# Patient Record
Sex: Female | Born: 1995 | Race: White | Hispanic: No | Marital: Single | Smoking: Never smoker
Health system: Southern US, Community
[De-identification: ages and names within clinical notes are randomized; demographics above are authoritative.]

---
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
## 2016-06-27 ENCOUNTER — Encounter (HOSPITAL_COMMUNITY): Payer: Self-pay | Admitting: *Deleted

## 2016-06-27 ENCOUNTER — Emergency Department (HOSPITAL_COMMUNITY)
Admission: EM | Admit: 2016-06-27 | Discharge: 2016-06-27 | Disposition: A | Discharge status: Home / Self Care | Payer: Self-pay | Attending: Emergency Medicine | Admitting: Emergency Medicine

## 2016-06-27 ENCOUNTER — Emergency Department (HOSPITAL_COMMUNITY): Payer: Self-pay

## 2016-06-27 DIAGNOSIS — L509 Urticaria, unspecified: Secondary | ICD-10-CM | POA: Insufficient documentation

## 2016-06-27 DIAGNOSIS — B09 Unspecified viral infection characterized by skin and mucous membrane lesions: Secondary | ICD-10-CM | POA: Insufficient documentation

## 2016-06-27 LAB — RAPID STREP SCREEN (MED CTR MEBANE ONLY): Streptococcus, Group A Screen (Direct): NEGATIVE

## 2016-06-27 MED ORDER — DIPHENHYDRAMINE HCL 25 MG PO CAPS
25.0000 mg | ORAL_CAPSULE | Freq: Once | ORAL | Status: AC
  Administered 2016-06-27: 25 mg via ORAL
  Filled 2016-06-27: qty 1

## 2016-06-27 NOTE — ED Triage Notes (Signed)
Pt complains of intermittent fever for the past 4 days, rash to legs and arms since last night. Pt went to [facility] today and was given steroid shot and prescription for prednisone. Pt states she feels more tired and "out of it" since visiting [facility]. Pt denies cough, nausea, vomiting, diarrhea. Pt states rash has improved since receiving shot.

## 2016-06-27 NOTE — ED Provider Notes (Signed)
[facility]-EMERGENCY DEPT Provider Note   CSN: [id number] Arrival date & time: 06/27/16  1944  By signing my name below, I, Octavia Heirrianna Nassar, attest that this documentation has been prepared under the direction and in the presence of Audry Piliyler Kingsten Enfield, PA-C.  Electronically Signed: Octavia HeirArianna Nassar, ED Scribe. 06/27/16. 7:59 PM.    History   Chief Complaint Chief Complaint  Patient presents with  . Fever  . Rash    The history is provided by the patient. No language interpreter was used.   HPI Comments: Monique Collins is a 20 y.o. female who presents to the Emergency Department complaining of intermittent fever (102-103) x 4 days. She has been having an associated shortness of breath, nausea,  Erythematous/itchy rash to her inner thigh that has spread to the rest of her boyd. Pt has not taken any medication to alleviate her fever. She was seen as [facility] earlier today where she received a steroid shot and a prescription for prednisone. Pt states that her rash is gradually getting better after receiving the shot. She has been around sick contacts at her job. Dad notes that pt recently wore new pants from another country but does not know any other source. Denies any new detergents, food or exposure to new medications.  She further denies neck pain, vomiting, abdominal pain, chest pain, or dysuria.  History reviewed. No pertinent past medical history.  There are no active problems to display for this patient.   History reviewed. No pertinent surgical history.  OB History    No data available       Home Medications    Prior to Admission medications   Not on File    Family History No family history on file.  Social History Social History  Substance Use Topics  . Smoking status: Never Smoker  . Smokeless tobacco: Never Used  . Alcohol use No     Allergies   Patient has no allergy information on record.   Review of Systems Review of Systems  Constitutional: Positive for fever.    Cardiovascular: Negative for chest pain.  Gastrointestinal: Negative for abdominal pain.  Genitourinary: Negative for dysuria.  Musculoskeletal: Negative for neck pain.  Skin: Positive for rash.     Physical Exam Updated Vital Signs BP (!) 119/101 (BP Location: Left Arm)   Pulse 116   Temp 99.9 F (37.7 C) (Oral)   Resp 18   Ht 5\' 2"  (1.575 m)   Wt 62.6 kg   LMP 06/08/2016   SpO2 100%   BMI 25.24 kg/m   Physical Exam  Constitutional: She is oriented to person, place, and time. She appears well-developed and well-nourished.  Pt phonating well  HENT:  Head: Normocephalic.  Post oropharynx has no exudate, edema or erythema  Eyes: EOM are normal.  Neck: Normal range of motion.  No nuchal rigidity, FROM of neck without pain  Cardiovascular: Normal rate.   Pulmonary/Chest: Effort normal.  Abdominal: She exhibits no distension.  Musculoskeletal: Normal range of motion.  Neurological: She is alert and oriented to person, place, and time.  Skin: Rash noted. There is erythema.  Diffuse erythematous urticarial rash to the bilateral upper/lower extremities, torso and back. Palms and feet spared.   Psychiatric: She has a normal mood and affect.  Nursing note and vitals reviewed.  ED Treatments / Results  DIAGNOSTIC STUDIES: Oxygen Saturation is 100% on RA, normal by my interpretation.  COORDINATION OF CARE:  8:31 PM Discussed treatment plan with pt at bedside and pt agreed  to plan.  Labs (all labs ordered are listed, but only abnormal results are displayed) Labs Reviewed - No data to display  EKG  EKG Interpretation None       Radiology Dg Chest 2 View  Result Date: 06/27/2016 CLINICAL DATA:  Cough EXAM: CHEST  2 VIEW COMPARISON:  None. FINDINGS: Normal heart size and mediastinal contours. No acute infiltrate or edema. No effusion or pneumothorax. No acute osseous findings. IMPRESSION: Negative chest. Electronically Signed   By: Marnee SpringJonathon  Watts M.D.   On:  06/27/2016 20:51    Procedures Procedures (including critical care time)  Medications Ordered in ED Medications - No data to display   Initial Impression / Assessment and Plan / ED Course  I have reviewed the triage vital signs and the nursing notes.  Pertinent labs & imaging results that were available during my care of the patient were reviewed by me and considered in my medical decision making (see chart for details).  Clinical Course    Final Clinical Impressions(s) / ED Diagnoses  I have reviewed and evaluated the relevant laboratory values I have reviewed and evaluated the relevant imaging studies.  I have reviewed the relevant previous healthcare records.I obtained HPI from historian.  ED Course:  Assessment: Pt is a 20yF who presents with fever and rash x 4 days. [facility] at [facility]. Rx prednisone. On exam, pt in NAD. Nontoxic/nonseptic appearing. VS. Slight tachycardia. Afebrile. Lungs CTA. Heart RRR. Abdomen nontender soft. Posterior oropharynx with mild erythema. No exudate. No nuchal rigidity. Full ROM of neck without pain or discomfort. CXR unremarkable. Strep negative. Given benadryl in ED. Likely Viral exanthem. Improved in ED. Continued steroids and benadryl. Follow up to PCP. Plan is to DC home. At time of discharge, Patient is in no acute distress. Vital Signs are stable. Patient is able to ambulate. Patient able to tolerate PO.   Disposition/Plan:  DC Home Additional Verbal discharge instructions given and discussed with patient.  Pt Instructed to f/u with PCP in the next week for evaluation and treatment of symptoms. Return precautions given Pt acknowledges and agrees with plan  Supervising Physician Arby BarretteMarcy Pfeiffer, MD   Final diagnoses:  Viral exanthem   I personally performed the services described in this documentation, which was scribed in my presence. The recorded information has been reviewed and is accurate.   New  Prescriptions New Prescriptions   No medications on file     Audry Piliyler Penn Gr[place]ett, PA-C 06/27/16 2120    Arby BarretteMarcy Pfeiffer, MD 06/27/16 2332

## 2016-06-27 NOTE — Discharge Instructions (Signed)
Please read and follow all provided instructions.  Your diagnoses today include:  1. Viral exanthem    You appear to have an upper respiratory infection (URI). An upper respiratory tract infection, or cold, is a viral infection of the air passages leading to the lungs. It should improve gradually after 5-7 days. You may have a lingering cough that lasts for 2- 4 weeks after the infection.  Tests performed today include: Vital signs. See below for your results today.   Medications prescribed:   Take any prescribed medications only as directed. Treatment for your infection is aimed at treating the symptoms. There are no medications, such as antibiotics, that will cure your infection.   Home care instructions:  Follow any educational materials contained in this packet.   Your illness is contagious and can be spread to others, especially during the first 3 or 4 days. It cannot be cured by antibiotics or other medicines. Take basic precautions such as washing your hands often, covering your mouth when you cough or sneeze, and avoiding public places where you could spread your illness to others.   Please continue drinking plenty of fluids.  Use over-the-counter medicines as needed as directed on packaging for symptom relief.  You may also use ibuprofen or tylenol as directed on packaging for pain or fever.  Do not take multiple medicines containing Tylenol or acetaminophen to avoid taking too much of this medication.  Follow-up instructions: Please follow-up with your primary care provider in the next 3 days for further evaluation of your symptoms if you are not feeling better.   Return instructions:  Please return to the Emergency Department if you experience worsening symptoms.  RETURN IMMEDIATELY IF you develop shortness of breath, confusion or altered mental status, a new rash, become dizzy, faint, or poorly responsive, or are unable to be cared for at home. Please return if you have  persistent vomiting and cannot keep down fluids or develop a fever that is not controlled by tylenol or motrin.   Please return if you have any other emergent concerns.  Additional Information:  Your vital signs today were: BP (!) 119/101 (BP Location: Left Arm)    Pulse 116    Temp 99.9 F (37.7 C) (Oral)    Resp 18    Ht 5\' 2"  (1.575 m)    Wt 62.6 kg    LMP 06/08/2016    SpO2 100%    BMI 25.24 kg/m  If your blood pressure (BP) was elevated above 135/85 this visit, please have this repeated by your doctor within one month. --------------

## 2016-06-29 LAB — CULTURE, GROUP A STREP (THRC)

## 2016-06-30 ENCOUNTER — Telehealth (HOSPITAL_BASED_OUTPATIENT_CLINIC_OR_DEPARTMENT_OTHER): Payer: Self-pay

## 2016-06-30 NOTE — Telephone Encounter (Signed)
No treatment for Strep culture per Malena Peerorey Bal Pharm D

## 2017-12-25 IMAGING — CR DG CHEST 2V
2 series · 2 of 2 positions shown · non-contrast
Comparison: None.

CLINICAL DATA: Cough

EXAM:
CHEST  2 VIEW

[w chest pa]
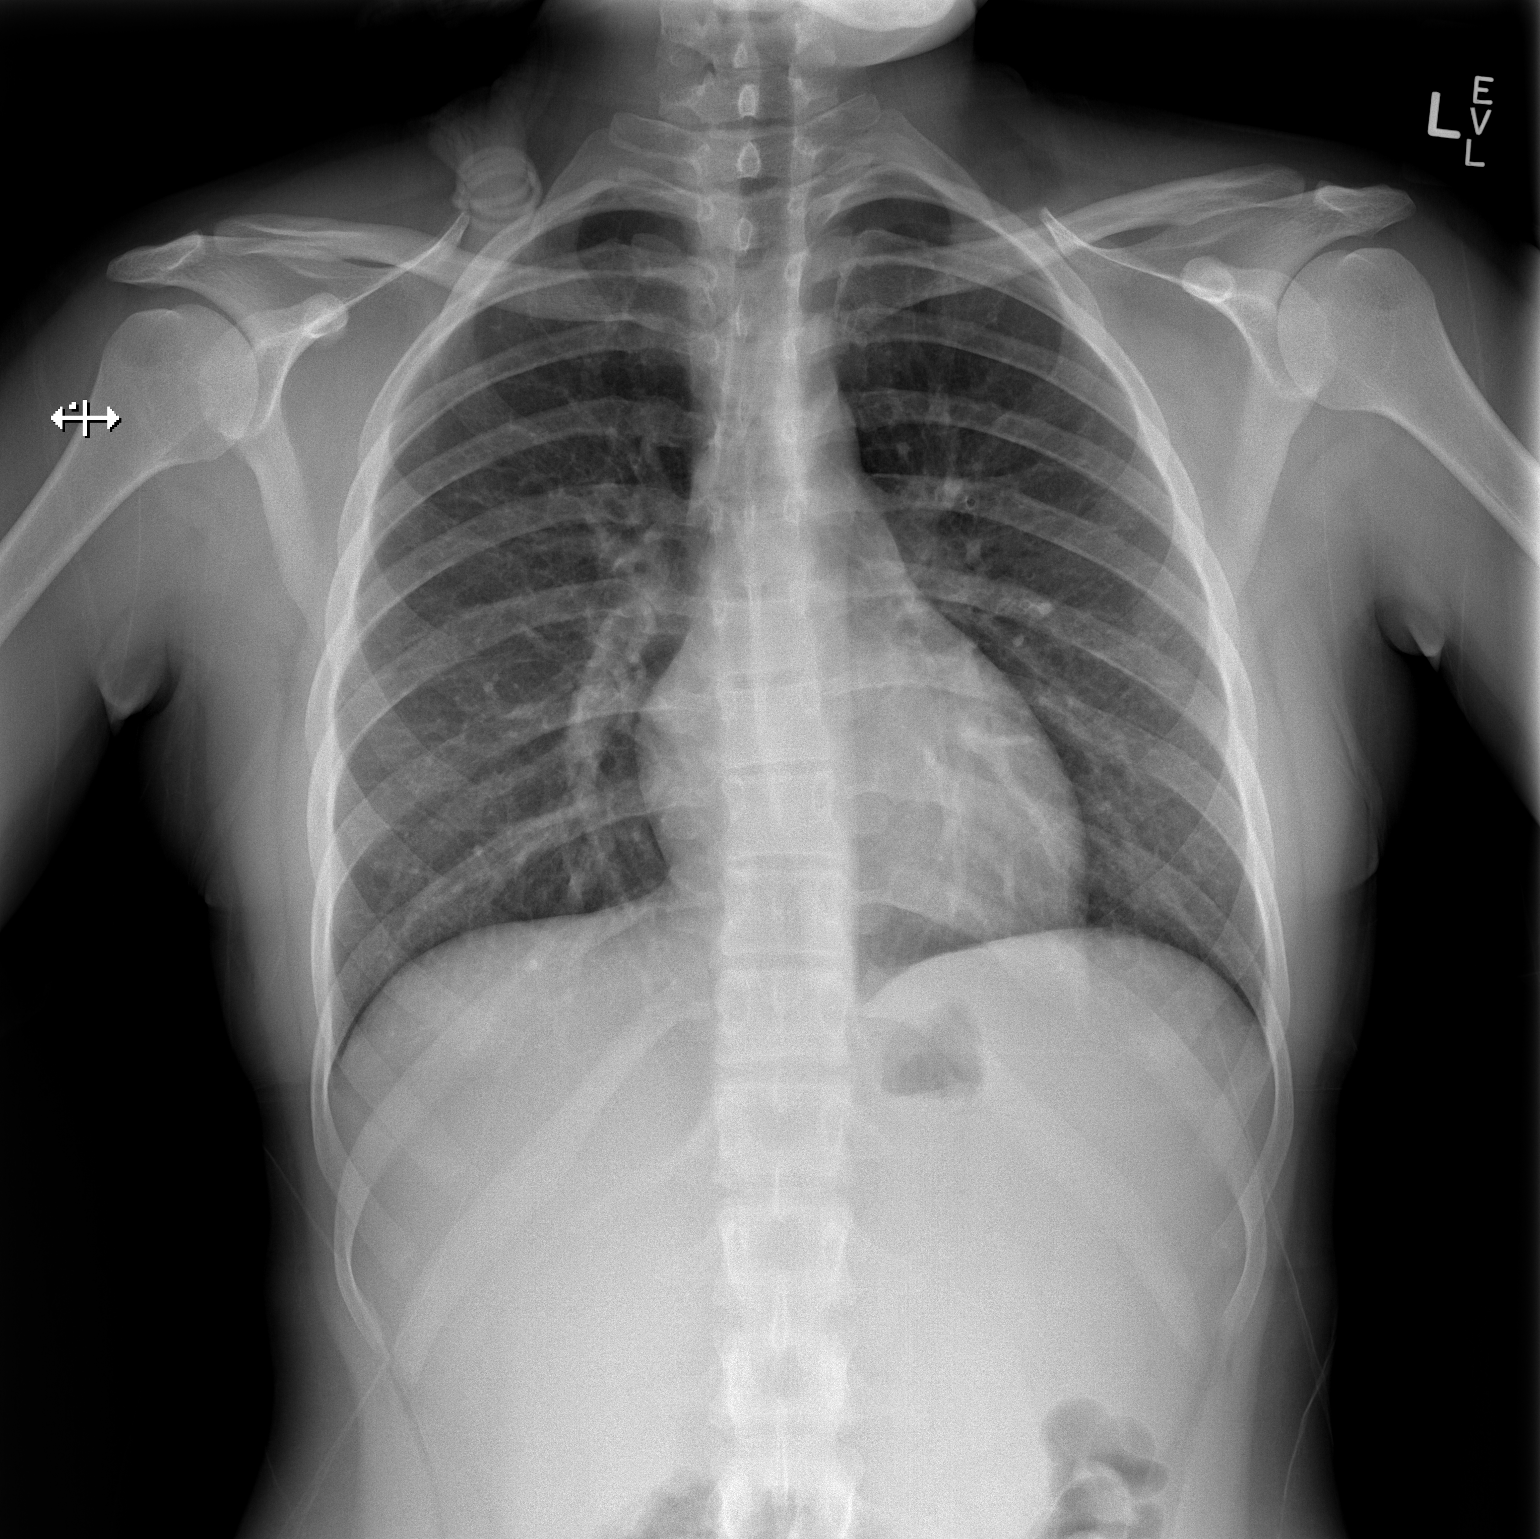

[w chest lat]
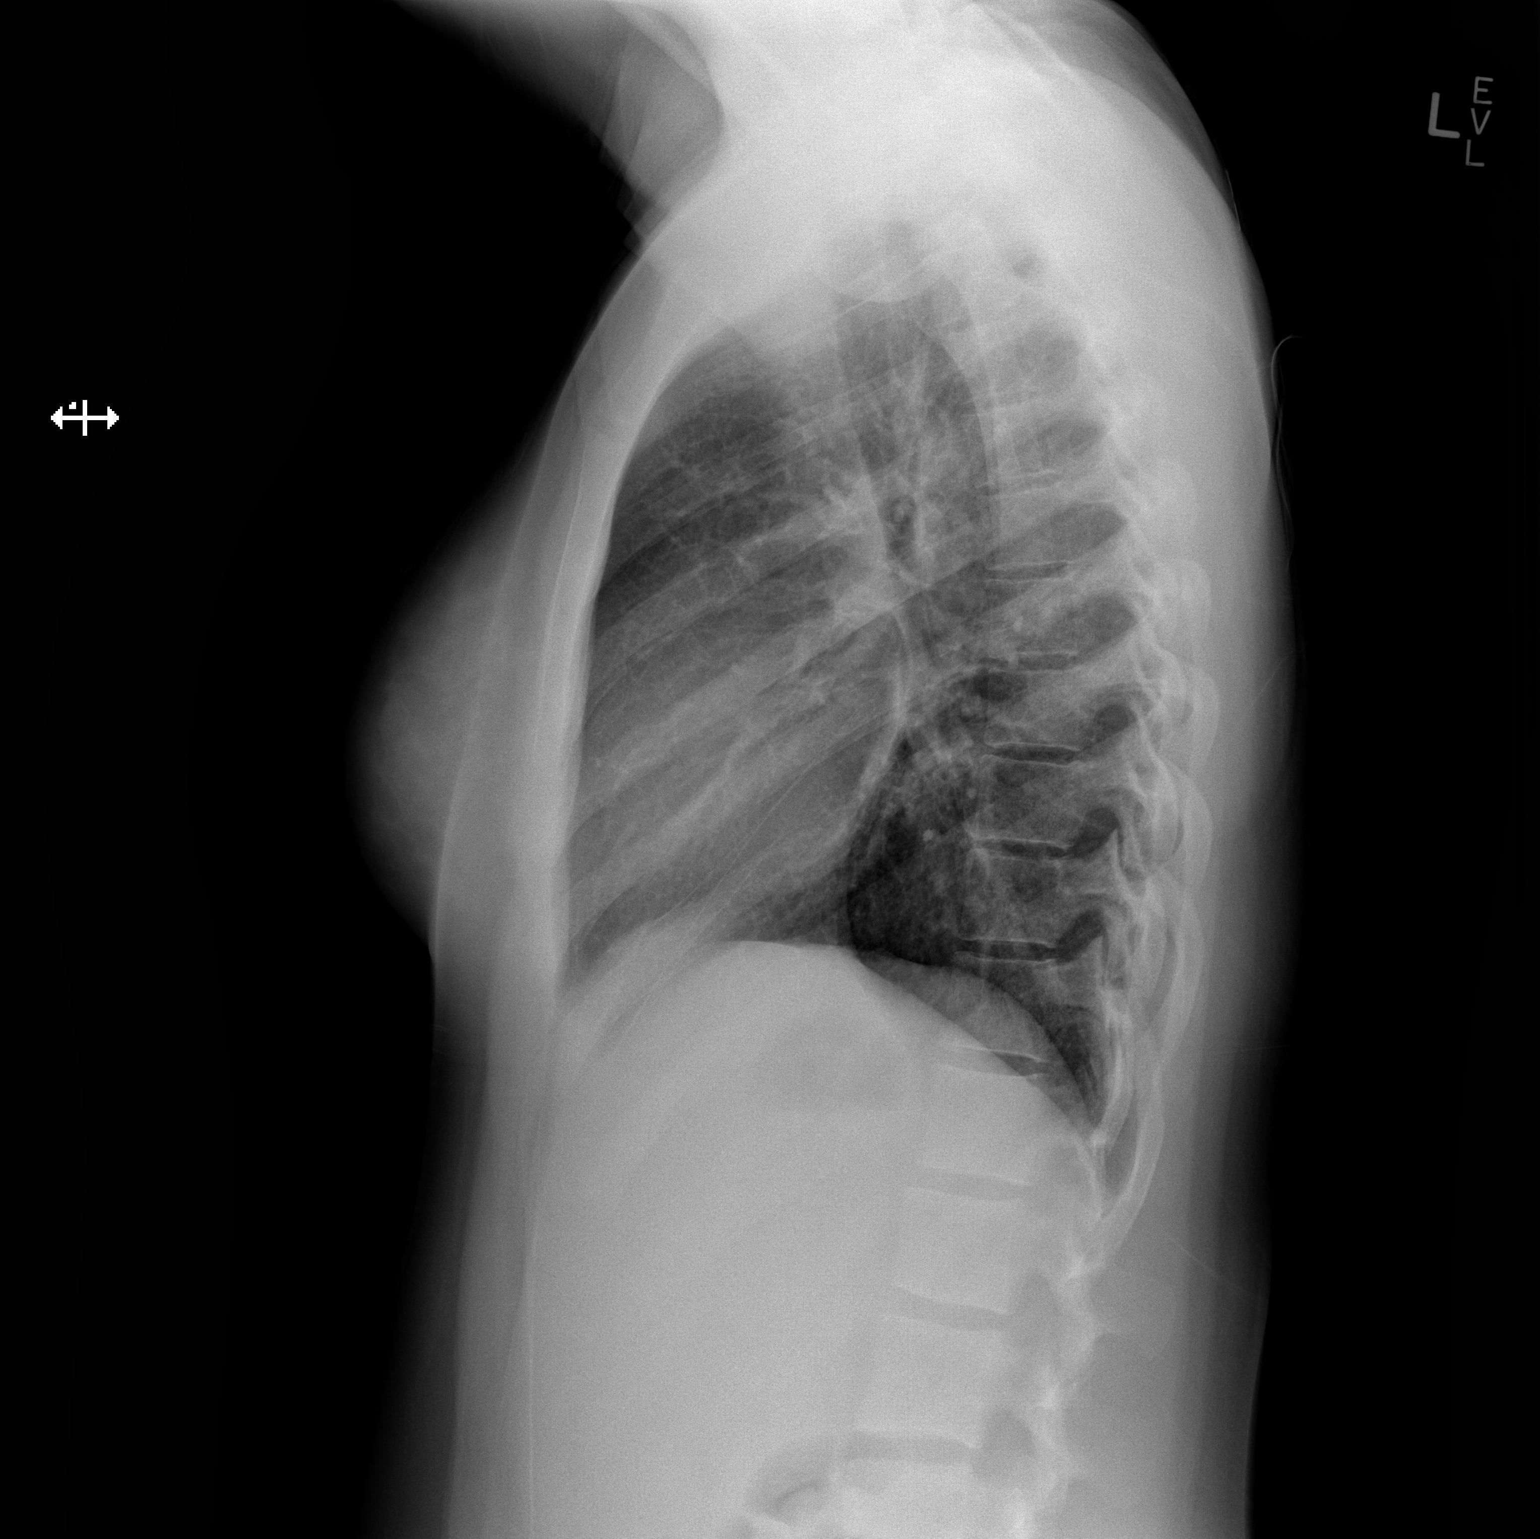

[2 of 2 positions shown; findings below may reference images not displayed]

FINDINGS: Normal heart size and mediastinal contours. No acute infiltrate or
edema. No effusion or pneumothorax. No acute osseous findings.
IMPRESSION: Negative chest.

## 2020-11-01 ENCOUNTER — Ambulatory Visit: Payer: Self-pay | Admitting: Emergency Medicine
